# Patient Record
Sex: Male | Born: 1960 | Race: Black or African American | Hispanic: No | Marital: Married
Health system: Southern US, Community
[De-identification: ages and names within clinical notes are randomized; demographics above are authoritative.]

---

## 2015-07-12 ENCOUNTER — Ambulatory Visit: Payer: Self-pay | Admitting: Orthopedic Surgery

## 2015-08-01 ENCOUNTER — Other Ambulatory Visit: Payer: Self-pay | Admitting: Orthopedic Surgery

## 2015-08-01 ENCOUNTER — Ambulatory Visit (HOSPITAL_COMMUNITY)
Admission: RE | Admit: 2015-08-01 | Discharge: 2015-08-01 | Disposition: A | Discharge status: Home / Self Care | Payer: BLUE CROSS/BLUE SHIELD | Source: Ambulatory Visit | Attending: Orthopedic Surgery | Admitting: Orthopedic Surgery

## 2015-08-01 DIAGNOSIS — M5442 Lumbago with sciatica, left side: Secondary | ICD-10-CM

## 2015-08-01 DIAGNOSIS — M545 Low back pain: Secondary | ICD-10-CM | POA: Insufficient documentation

## 2015-08-01 DIAGNOSIS — M1711 Unilateral primary osteoarthritis, right knee: Secondary | ICD-10-CM | POA: Insufficient documentation

## 2015-08-01 DIAGNOSIS — M25562 Pain in left knee: Secondary | ICD-10-CM

## 2015-08-01 DIAGNOSIS — M25561 Pain in right knee: Secondary | ICD-10-CM

## 2015-08-01 DIAGNOSIS — M5441 Lumbago with sciatica, right side: Secondary | ICD-10-CM

## 2015-08-04 ENCOUNTER — Ambulatory Visit: Payer: Self-pay | Admitting: Orthopedic Surgery

## 2015-08-16 ENCOUNTER — Telehealth: Payer: Self-pay | Admitting: *Deleted

## 2015-08-16 ENCOUNTER — Ambulatory Visit (INDEPENDENT_AMBULATORY_CARE_PROVIDER_SITE_OTHER): Payer: BLUE CROSS/BLUE SHIELD | Admitting: Orthopedic Surgery

## 2015-08-16 VITALS — BP 138/80 | Ht 73.0 in | Wt 217.0 lb

## 2015-08-16 DIAGNOSIS — M170 Bilateral primary osteoarthritis of knee: Secondary | ICD-10-CM

## 2015-08-16 DIAGNOSIS — M47816 Spondylosis without myelopathy or radiculopathy, lumbar region: Secondary | ICD-10-CM

## 2015-08-16 DIAGNOSIS — M479 Spondylosis, unspecified: Secondary | ICD-10-CM | POA: Insufficient documentation

## 2015-08-16 NOTE — Progress Notes (Signed)
Patient ID: Erik Fowler, male   DOB: 17-Feb-1961, 54 y.o.   MRN: [id number]  New patient   Chief Complaint  Patient presents with  . Knee Pain    bilateral knee pain, LT>RT, back pain     Erik Fowler is a 54 y.o. male.   HPI 54 year old male having pain in his knees and back since 70. He says he thinks he injured his left knee jumping over a obstacle while he was in the [employer]. He said that the doctors told him that his medial knee muscles were not strong enough to hold his knee cap in place.  In any event he has bilateral knee pain left worse than right with locking, stiffness, giving out. Intermingled with this as numbness and tingling from his chronic back pain for which he takes oxycodone 10 and hydrocodone 10 from Kathreen Devoid? M.D. [place].  He has constant throbbing sharp pain on the medial aspect of his left knee with stabbing aching pain and then he has pain radiating from his back to both legs. His pain is 7 out of 10 but controlled with hydrocodone and 10 mg oxycodone. He is also on nabumetone 500 mg twice a day  He had injections in his back not his knee and he had therapy  Review of systems: Complete system review revealed that he complains of intermittent chest pain sinus problem dental problems diarrhea and nausea vomiting numbness in his leg burning pain in his legs hayfever seasonal allergy skin rashes redness  Joint pain limb pain muscle weakness stiff joints swollen joints and back pain although the systems were reviewed and were normal  No medical problems were listed  He had one surgery to remove a mole from his back in 2010  No allergies  Family history heart disease heart failure kidney disease cancer  Does not smoke  He has an occasional beer  He is employed as a Estate manager/land agent   Review of Systems See hpi  No past medical history on file.  No past surgical history on file.  No family history on file.  Social  History Social History  Substance Use Topics  . Smoking status: Not on file  . Smokeless tobacco: Not on file  . Alcohol Use: Not on file    Allergies not on file  Current Outpatient Prescriptions  Medication Sig Dispense Refill  . DiphenhydrAMINE HCl (BENADRYL ALLERGY PO) Take by mouth.    Marland Kitchen HYDROcodone-acetaminophen (NORCO) 10-325 MG per tablet 1 tablet.    . nabumetone (RELAFEN) 500 MG tablet 500 mg.    . oxyCODONE-acetaminophen (PERCOCET) 10-325 MG per tablet 1 tablet.     No current facility-administered medications for this visit.       Physical Exam Blood pressure 138/80, height  (1.854 m), weight 217 lb (98.431 kg). Physical Exam The patient is well developed well nourished and well groomed. Orientation to person place and time is normal  Mood is pleasant. Ambulatory status is normal without a limp Skin remains intact without laceration ulceration or erythema Gross motor exam is intact without atrophy. Muscle tone normal grade 5 motor strength Neurovascular exam remains intact Inspection left knee small effusion full range of motion. No scars. Right knee no range of motion deficits full range of motion no effusion  Both knees are stable in all planes. Positive straight leg raise bilaterally produced back pain no real radicular pain at that point.  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 Lumbar degenerative disc disease.  Assessment   This patient will probably come to bilateral knee replacement surgery and may even need something done to his back. 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 He will need a postoperative pain management intervention such as an epidural catheter and pain management over and above what  is routine for knee replacement and therefore I'm sending him to a tertiary care facility. I would also like them to evaluate his lumbar spine further for further treatment. He is a complicated patient at high risk for failure of knee replacement secondary to opiate tolerance   Procedure note left knee injection verbal consent was obtained to inject left knee joint  Timeout was completed to confirm the site of injection  The medications used were 40 mg of Depo-Medrol and 1% lidocaine 3 cc  Anesthesia was provided by ethyl chloride and the skin was prepped with alcohol.  After cleaning the skin with alcohol a 20-gauge needle was used to inject the left knee joint. There were no complications. A sterile bandage was applied.   Procedure note right knee injection verbal consent was obtained to inject right knee joint  Timeout was completed to confirm the site of injection  The medications used were 40 mg of Depo-Medrol and 1% lidocaine 3 cc  Anesthesia was provided by ethyl chloride and the skin was prepped with alcohol.  After cleaning the skin with alcohol a 20-gauge needle was used to inject the right knee joint. There were no complications. A sterile bandage was applied.    Plan   Referral to [facility]  I did inject both knees. No follow-up necessary.

## 2015-08-16 NOTE — Addendum Note (Signed)
Addended by: Diamantina Monks on: 08/16/2015 05:47 PM   Modules accepted: Orders

## 2015-08-16 NOTE — Telephone Encounter (Signed)
REFERRAL FAXED TO [facility]

## 2015-08-16 NOTE — Telephone Encounter (Signed)
REFERRAL FAXED TO [facility] 

## 2015-08-16 NOTE — Patient Instructions (Signed)
We will refer you to orthopedic surgery for bilateral knee OA and neurosurgery lumbar spondylosis, both referrals to [facility]     Joint Injection Care After Refer to this sheet in the next few days. These instructions provide you with information on caring for yourself after you have had a joint injection. Your caregiver also may give you more specific instructions. Your treatment has been planned according to current medical practices, but problems sometimes occur. Call your caregiver if you have any problems or questions after your procedure. After any type of joint injection, it is not uncommon to experience:  Soreness, swelling, or bruising around the injection site.  Mild numbness, tingling, or weakness around the injection site caused by the numbing medicine used before or with the injection. It also is possible to experience the following effects associated with the specific agent after injection:  Iodine-based contrast agents:  Allergic reaction (itching, hives, widespread redness, and swelling beyond the injection site).  Corticosteroids (These effects are rare.):  Allergic reaction.  Increased blood sugar levels (If you have diabetes and you notice that your blood sugar levels have increased, notify your caregiver).  Increased blood pressure levels.  Mood swings.  Hyaluronic acid in the use of viscosupplementation.  Temporary heat or redness.  Temporary rash and itching.  Increased fluid accumulation in the injected joint. These effects all should resolve within a day after your procedure.  HOME CARE INSTRUCTIONS  Limit yourself to light activity the day of your procedure. Avoid lifting heavy objects, bending, stooping, or twisting.  Take prescription or over-the-counter pain medication as directed by your caregiver.  You may apply ice to your injection site to reduce pain and swelling the day of your procedure. Ice may be applied 03-04 times:  Put ice in a  plastic bag.  Place a towel between your skin and the bag.  Leave the ice on for no longer than 15-20 minutes each time. SEEK IMMEDIATE MEDICAL CARE IF:   Pain and swelling get worse rather than better or extend beyond the injection site.  Numbness does not go away.  Blood or fluid continues to leak from the injection site.  You have chest pain.  You have swelling of your face or tongue.  You have trouble breathing or you become dizzy.  You develop a fever, chills, or severe tenderness at the injection site that last longer than 1 day. MAKE SURE YOU:  Understand these instructions.  Watch your condition.  Get help right away if you are not doing well or if you get worse. Document Released: 07/19/2011 Document Revised: 01/28/2012 Document Reviewed: 07/19/2011 [facility] Patient Information 2015 [place], [place]. This information is not intended to replace advice given to you by your health care provider. Make sure you discuss any questions you have with your health care provider.

## 2015-08-24 NOTE — Telephone Encounter (Signed)
appt with DR SHIELDS OCT 27 9:40A

## 2015-10-07 NOTE — Telephone Encounter (Signed)
[facility] states that patient would need MRI before surgeon would see him, advised we can send patient to their Spine specialist for a work up first  [facility] # [phone]

## 2016-12-07 IMAGING — DX DG LUMBAR SPINE COMPLETE 4+V
5 series · 5 of 5 positions shown · non-contrast
Comparison: None.

CLINICAL DATA: Back pain with sciatica.

EXAM:
LUMBAR SPINE - COMPLETE 4+ VIEW

[l-spine ap]
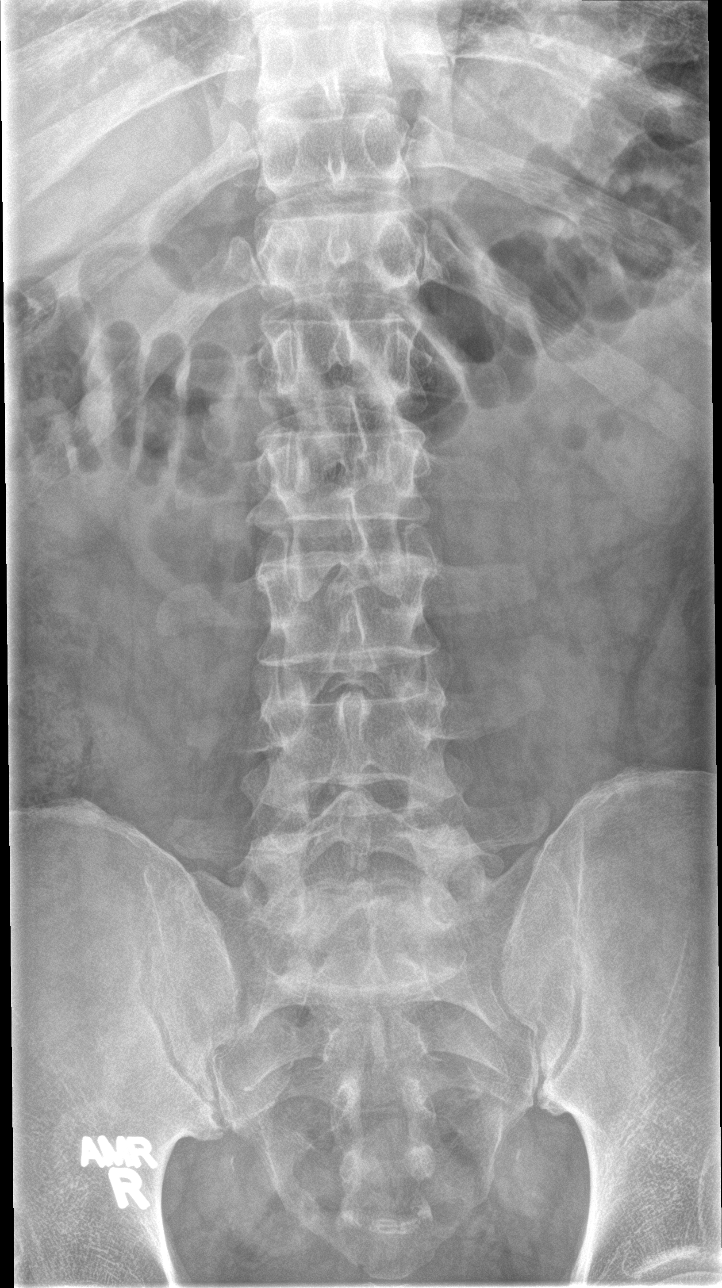

[l-spine obl (1 of 2)]
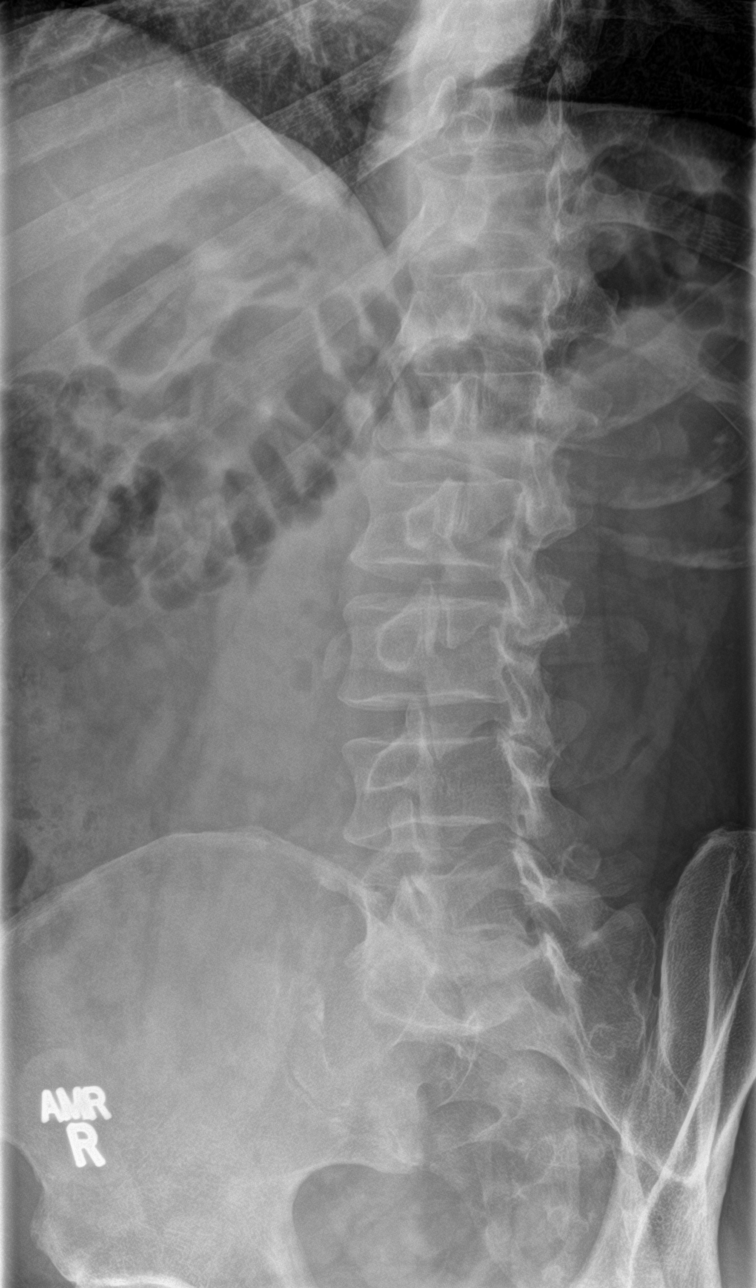

[l-spine obl (2 of 2)]
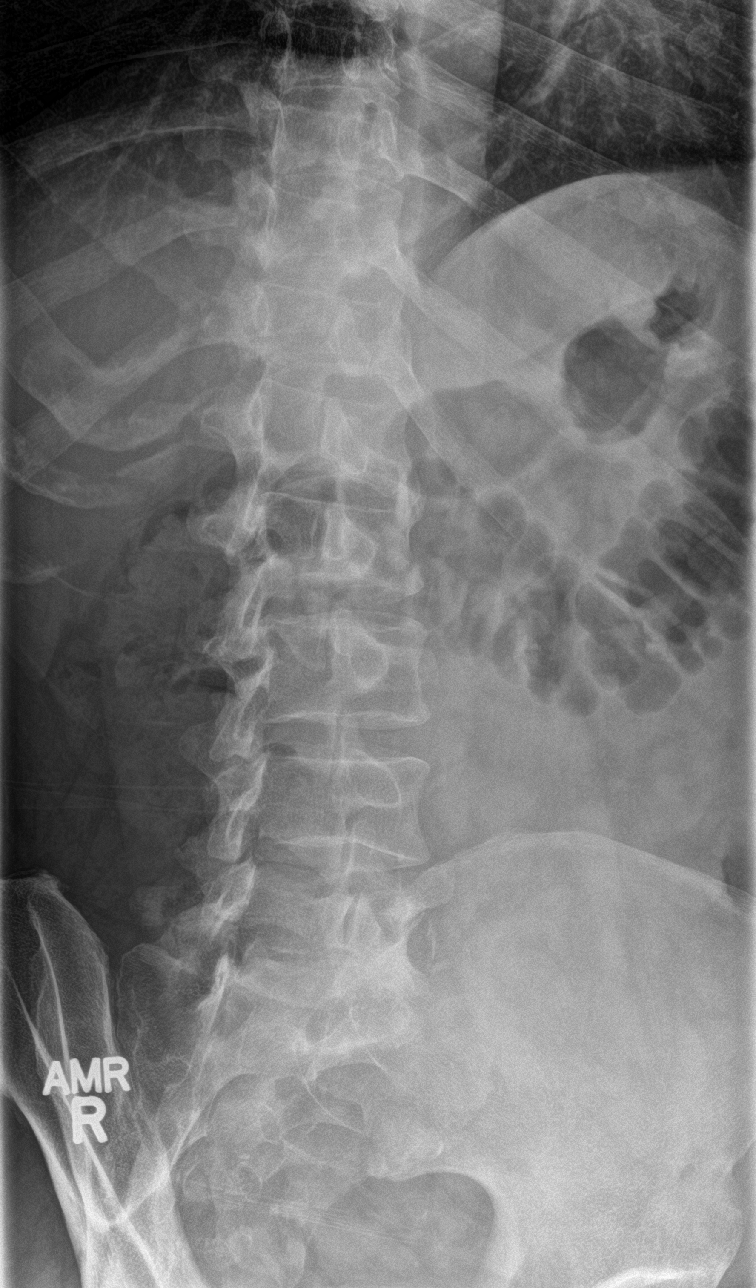

[l-spine lat]
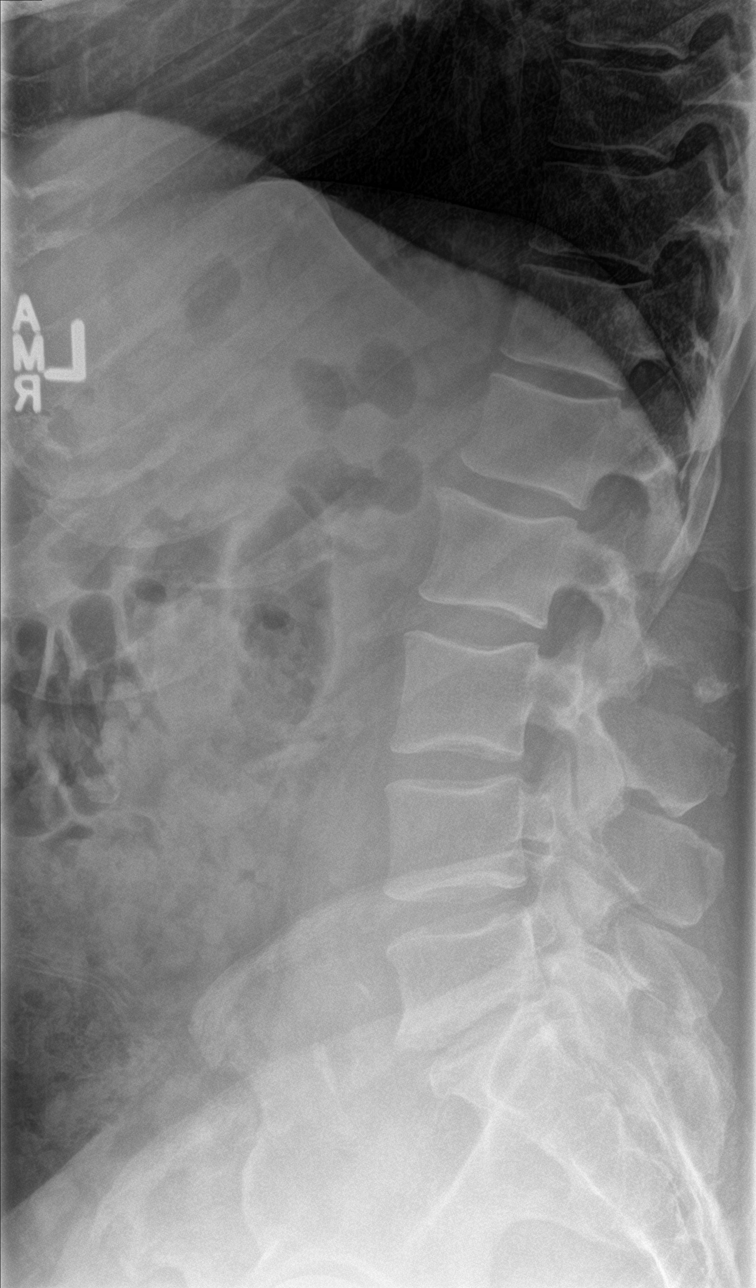

[l-spine spot]
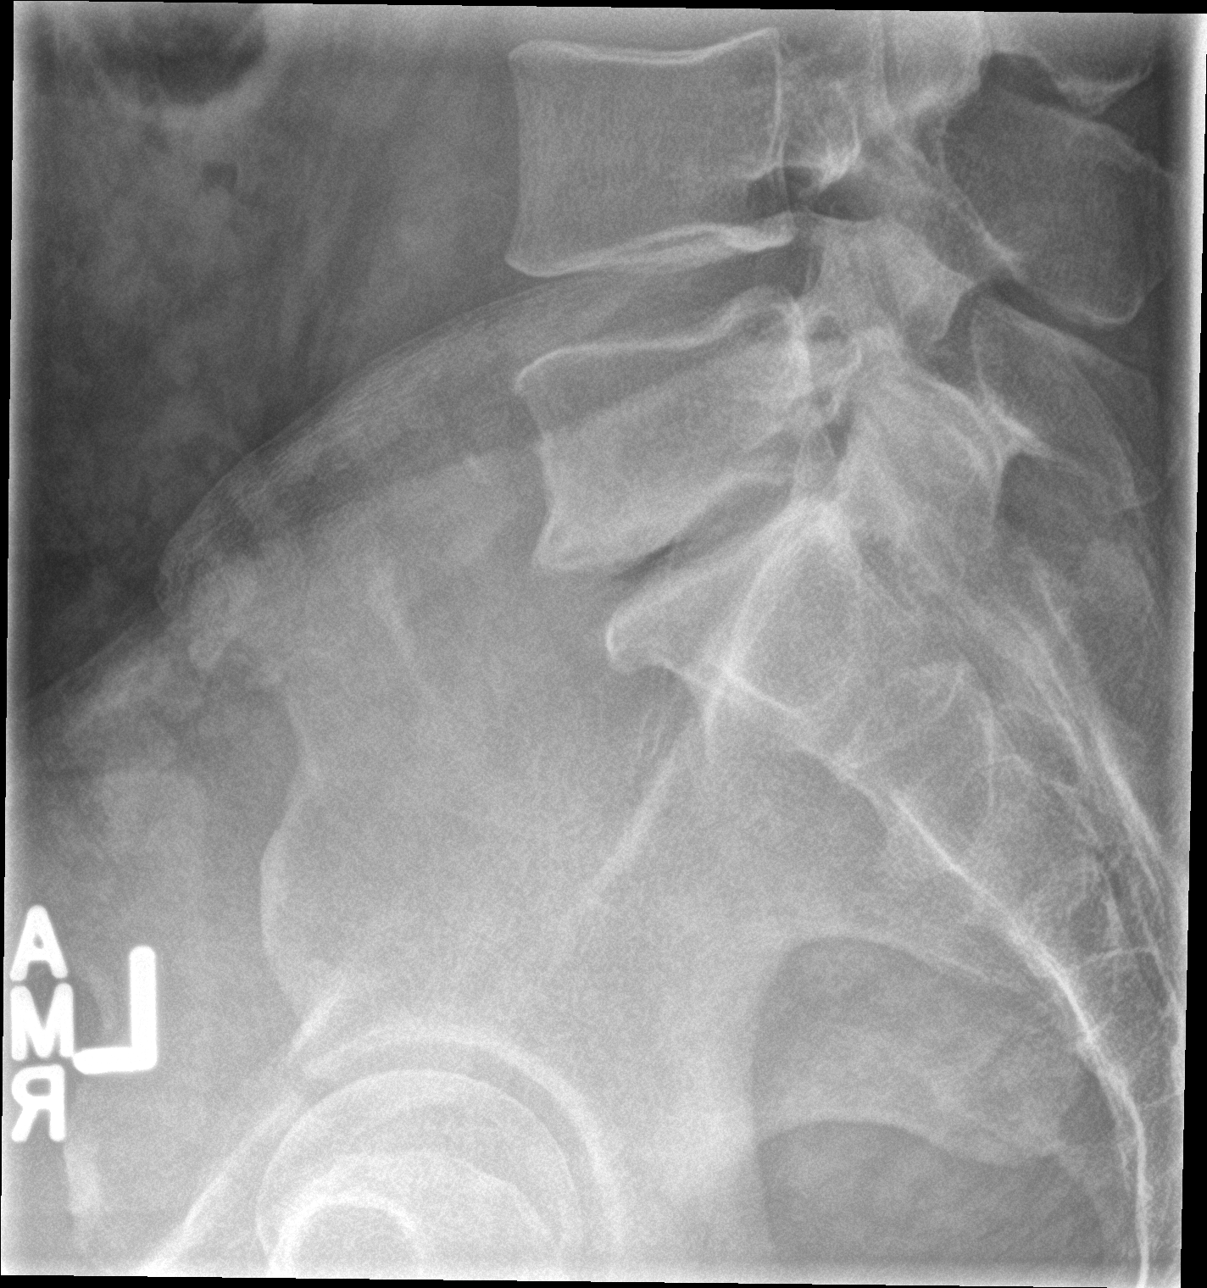

[5 of 5 positions shown; findings below may reference images not displayed]

FINDINGS: Diffuse degenerative change lumbar spine. Prominent disc space loss
with endplate osteophyte formation noted L5-S1. No acute bony
abnormality. Aortoiliac atherosclerotic vascular disease .
IMPRESSION: Degenerative changes lumbar spine. Disc space loss noted L5-S1 . No
acute abnormality identified.

## 2016-12-07 IMAGING — DX DG KNEE COMPLETE 4+V*R*
4 series · 4 of 4 positions shown · non-contrast
Comparison: None.

CLINICAL DATA: Bilateral knee pain, mostly medial side, left
greater than right. No known injury.

EXAM:
RIGHT KNEE - COMPLETE 4+ VIEW

[knee ap]
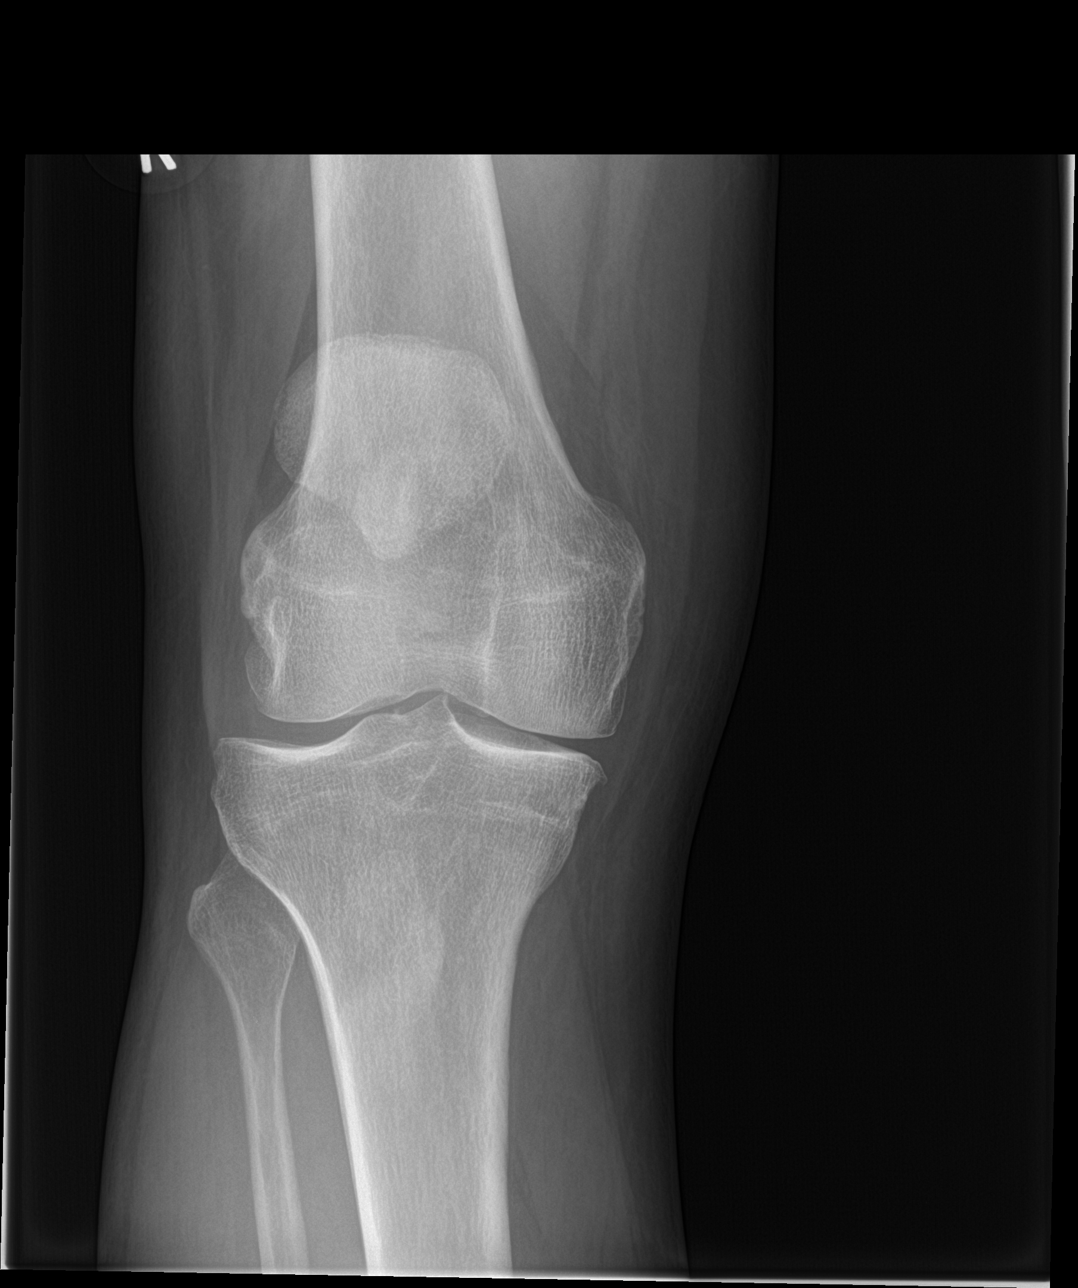

[tunnel]
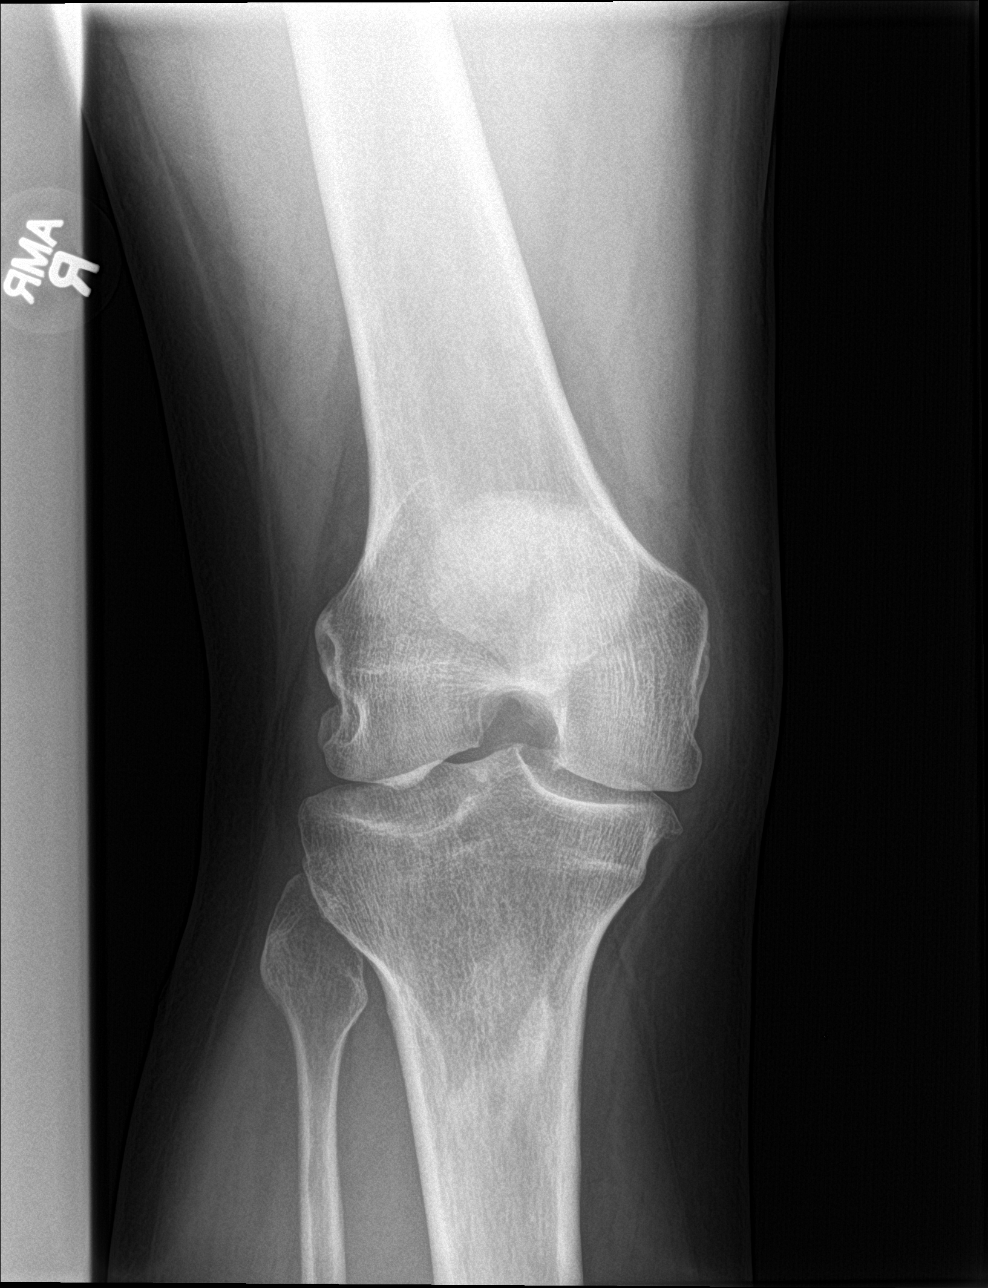

[knee lat]
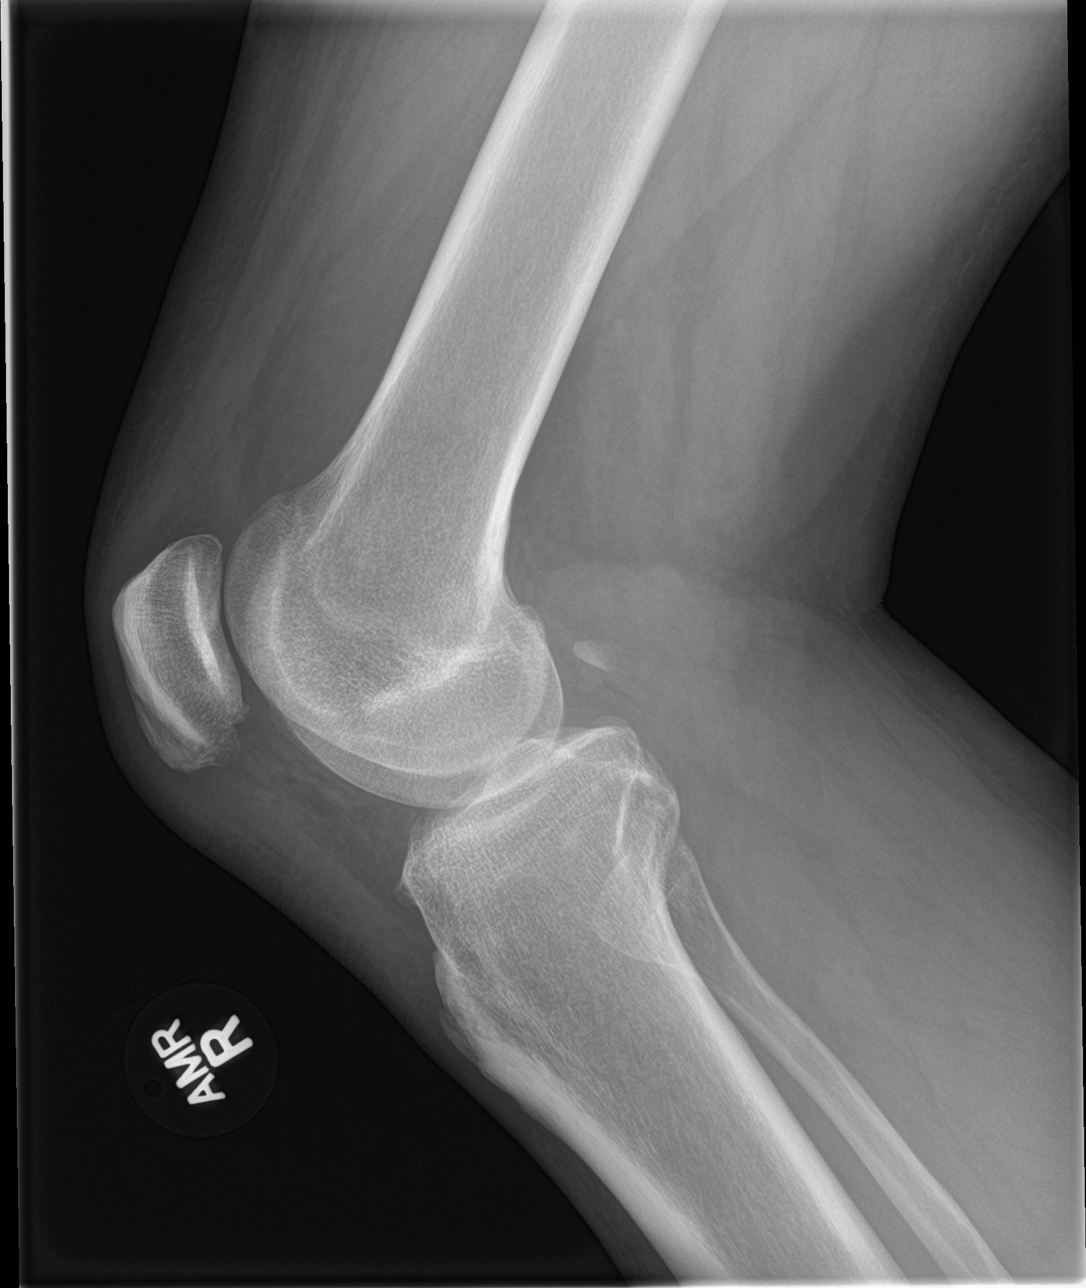

[knee sunrise]
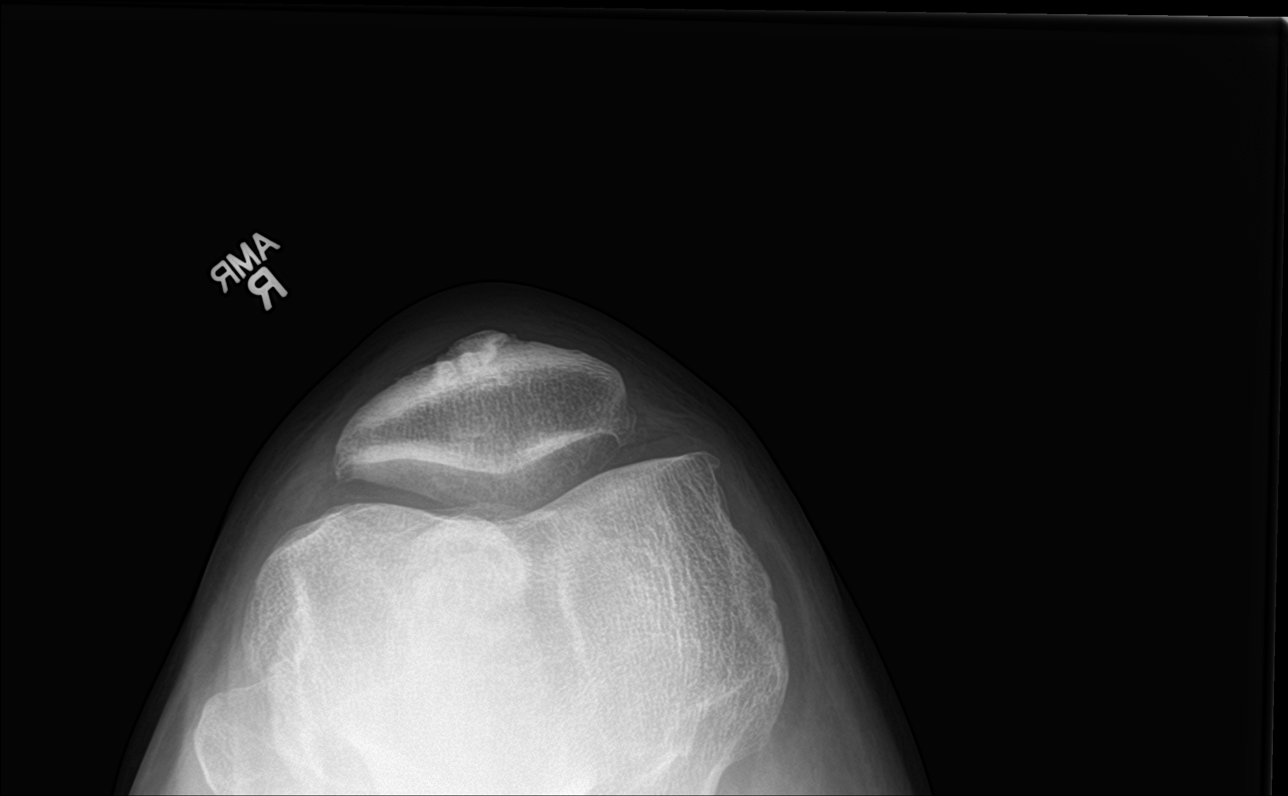

[4 of 4 positions shown; findings below may reference images not displayed]

FINDINGS: Mild joint space narrowing and spurring in all 3 compartments. No
acute bony abnormality. Specifically, no fracture, subluxation, or
dislocation. Soft tissues are intact. No joint effusion.
IMPRESSION: Mild degenerative joint disease in all 3 compartments. No acute
findings.
